# Patient Record
Sex: Female | Born: 2000 | Hispanic: Yes | Marital: Single | Smoking: Never smoker
Health system: Southern US, Community
[De-identification: ages and names within clinical notes are randomized; demographics above are authoritative.]

## PROBLEM LIST (undated history)

## (undated) DIAGNOSIS — Z789 Other specified health status: Secondary | ICD-10-CM

## (undated) HISTORY — PX: NO PAST SURGERIES: SHX2092

---

## 2019-11-22 ENCOUNTER — Other Ambulatory Visit (INDEPENDENT_AMBULATORY_CARE_PROVIDER_SITE_OTHER): Payer: Self-pay

## 2019-11-22 ENCOUNTER — Other Ambulatory Visit: Payer: Self-pay

## 2019-11-22 DIAGNOSIS — Z3481 Encounter for supervision of other normal pregnancy, first trimester: Secondary | ICD-10-CM

## 2019-11-22 LAB — POCT URINALYSIS DIP (MANUAL ENTRY)
Bilirubin, UA: NEGATIVE
Blood, UA: NEGATIVE
Glucose, UA: NEGATIVE mg/dL
Ketones, POC UA: NEGATIVE mg/dL
Nitrite, UA: NEGATIVE
Protein Ur, POC: NEGATIVE mg/dL
Spec Grav, UA: 1.025 (ref 1.010–1.025)
Urobilinogen, UA: 0.2 E.U./dL
pH, UA: 6.5 (ref 5.0–8.0)

## 2019-11-22 LAB — POCT UA - MICROSCOPIC ONLY: Epithelial cells, urine per micros: >20

## 2019-11-24 ENCOUNTER — Encounter: Payer: Self-pay | Admitting: Family Medicine

## 2019-11-24 DIAGNOSIS — B951 Streptococcus, group B, as the cause of diseases classified elsewhere: Secondary | ICD-10-CM | POA: Insufficient documentation

## 2019-11-24 LAB — HGB FRACTIONATION CASCADE
Hgb A2: 2.8 % (ref 1.8–3.2)
Hgb A: 97.2 % (ref 96.4–98.8)
Hgb F: 0 % (ref 0.0–2.0)
Hgb S: 0 %

## 2019-11-24 LAB — OBSTETRIC PANEL, INCLUDING HIV
Antibody Screen: NEGATIVE
Basophils Absolute: 0 10*3/uL (ref 0.0–0.2)
Basos: 0 %
EOS (ABSOLUTE): 0.1 10*3/uL (ref 0.0–0.4)
Eos: 1 %
HIV Screen 4th Generation wRfx: NONREACTIVE
Hematocrit: 34.8 % (ref 34.0–46.6)
Hemoglobin: 11.9 g/dL (ref 11.1–15.9)
Hepatitis B Surface Ag: NEGATIVE
Immature Grans (Abs): 0 10*3/uL (ref 0.0–0.1)
Immature Granulocytes: 0 %
Lymphocytes Absolute: 2.3 10*3/uL (ref 0.7–3.1)
Lymphs: 22 %
MCH: 31.3 pg (ref 26.6–33.0)
MCHC: 34.2 g/dL (ref 31.5–35.7)
MCV: 92 fL (ref 79–97)
Monocytes Absolute: 0.6 10*3/uL (ref 0.1–0.9)
Monocytes: 6 %
Neutrophils Absolute: 7.4 10*3/uL — ABNORMAL HIGH (ref 1.4–7.0)
Neutrophils: 71 %
Platelets: 223 10*3/uL (ref 150–450)
RBC: 3.8 x10E6/uL (ref 3.77–5.28)
RDW: 14.5 % (ref 11.7–15.4)
RPR Ser Ql: NONREACTIVE
Rh Factor: POSITIVE
Rubella Antibodies, IGG: 5.19 index (ref >0.99)
WBC: 10.5 10*3/uL (ref 3.4–10.8)

## 2019-11-24 LAB — HEPATITIS C ANTIBODY: Hep C Virus Ab: <0.1 s/co ratio (ref 0.0–0.9)

## 2019-11-26 LAB — URINE CULTURE, OB REFLEX

## 2019-11-26 LAB — CULTURE, OB URINE

## 2019-11-28 NOTE — Progress Notes (Signed)
Patient Name: Whitney Allen Date of Birth: 2000/08/19 [facility] Medicine Center Initial Prenatal Visit  Whitney Allen is a 19 y.o. year old No obstetric history on file. at Unknown who presents for her initial prenatal visit. Pregnancy is not planned She reports no symptoms. She is taking a prenatal vitamin.  She denies pelvic pain or vaginal bleeding.   Pregnancy Dating: . The patient is dated by LMP, but it is uncertain. She had an early [place] at a pregnancy center, reportedly EDD was 04/21/20 on 08/27/19. Had patient sign ROI to attempt to retrieve this imaging report.  Marland Kitchen LMP: 06/10/19 . Period is certain:  Yes.  . Periods were regular:  Yes.  Marland Kitchen LMP was a typical period:  No.  . Using hormonal contraception in 3 months prior to conception: Yes  Lab Review: . Blood type: A . Rh Status: + . Antibody screen: Negative . HIV: Negative . RPR: Negative . Hemoglobin electrophoresis reviewed: Yes . Results of OB urine culture are: Positive for GBS . Rubella: Immune . Hep C Ab: Negative . Varicella status is Immune  PMH: Reviewed and as detailed below: . HTN: No  . Gestational Hypertension/preeclampsia: No  . Type 1 or 2 Diabetes: No  . Depression:  No  . Seizure disorder:  No . VTE: No ,  . History of STI No,  . Abnormal Pap smear:  No, . Genital herpes simplex:  No   PSH: . Gynecologic Surgery:  no . Surgical history reviewed, notable for: none  Obstetric History: . Obstetric history tab updated and reviewed. This is first pregnancy. . Summary of prior pregnancies: n/a . Cesarean delivery: No  . Gestational Diabetes:  No . Hypertension in pregnancy: No . History of preterm birth: No . History of LGA/SGA infant:  No . History of shoulder dystocia: No . Indications for referral were reviewed, and the patient has no obstetric indications for referral to High Risk OB Clinic at this time.   Social History: . Partner's name: Whitney Allen . Tobacco use: No . Alcohol  use:  No . Other substance use:  No  Current Medications:  . Prenatal vitamin  . Reviewed and appropriate in pregnancy.   Genetic and Infection Screen: . Flow Sheet Updated Yes  Prenatal Exam: Gen: Well nourished, well developed.  No distress.  Vitals noted. HEENT: Normocephalic, atraumatic.  Neck supple without cervical lymphadenopathy, thyromegaly or thyroid nodules.  Fair dentition. CV: RRR no murmur, gallops or rubs Lungs: CTA B.  Normal respiratory effort without wheezes or rales. Abd: soft, NTND. +BS.  19cm gravid uterus. Ext: No clubbing, cyanosis or edema. Psych: Normal grooming and dress.  Not depressed or anxious appearing.  Normal thought content and process without flight of ideas or looseness of associations  Fetal heart tones: Appropriate  Assessment/Plan:  Whitney Allen is a 19 y.o. No obstetric history on file. at Unknown who presents to initiate prenatal care. She is doing well.  Current pregnancy issues include late to prenatal care.  1. Routine prenatal care: Marland Kitchen As dating is not reliable, a dating ultrasound has been ordered. Dating tab updated. . Pre-pregnancy weight updated. Expected weight gain this pregnancy is 28-40 pounds . Prenatal labs reviewed, notable for GBS (+) asymptomatic bacteriuria, only 4K CFUs. . Indications for referral to HROB were reviewed and the patient does not meet criteria for referral.  . Medication list reviewed and updated.  . Recommended patient see a dentist for regular care.  . Bleeding and pain  precautions reviewed. . Importance of prenatal vitamins reviewed.  . Genetic screening offered. Patient opted for: no screening. . The patient does not have an indication for aspirin therapy beginning at 12-16 weeks. Aspirin was not  recommended today.  . The patient will not be age 72 or over at time of delivery. Referral to genetic counseling was not offered today.  . The patient has the following risk factors for preexisting  diabetes: Reviewed indications for early 1 hour glucose testing, not indicated . An early 1 hour glucose tolerance test was not ordered. . Pregnancy Medical Home and PHQ-9 forms completed, problems noted: Yes  2. Pregnancy issues include the following which were addressed today:  . Unclear dating. By LMP, patient is [redacted]w[redacted]d, she and her partner had a first trimester US done at a pregnancy center that stated on 08/27/19 EDD was 04/21/20, which would make patient [redacted]w[redacted]d. They do not have a copy of this Korea. ROI signed today to attempt to get the Korea. If unable to get this Korea, will have to use a second trimester Korea for dating. . Patient is most likely 19w, measuring 19cm, and most accurate if early Korea information is correct. Will order Anatomy US which is due now as well.  . Will have patient follow up with OB clinic as that will be last week of 2nd trimester for next visit   Follow up 4 weeks for next prenatal visit.  Shirlean Mylar, MD [facility] Family Medicine Residency, PGY-2

## 2019-11-28 NOTE — Patient Instructions (Addendum)
It was a pleasure to see you today!  1. Congratulations on your pregnancy. You need to take prenatal vitamins each day. Continue to stay away from cigarettes! Also, avoid raw meat, heat up deli meat until steaming, and soft cheeses (any unpasteurized cheese), as well as cat litter.   2. Go to the MAU at [facility] if:  You have pain in your lower abdomen or pelvic area  Your water breaks.  Sometimes it is a big gush of fluid, sometimes it is just a trickle that keeps getting your underwear wet or running down your legs  You have vaginal bleeding.  3. I recommend you follow up in 4 weeks for your next appointment.  4. We are scheduling your anatomy scan next Monday 11/29 at 1 [facility]   Be Well,  Dr. Leary Roca

## 2019-11-29 ENCOUNTER — Ambulatory Visit (INDEPENDENT_AMBULATORY_CARE_PROVIDER_SITE_OTHER): Payer: Self-pay | Admitting: Family Medicine

## 2019-11-29 ENCOUNTER — Encounter: Payer: Self-pay | Admitting: Family Medicine

## 2019-11-29 ENCOUNTER — Other Ambulatory Visit: Payer: Self-pay

## 2019-11-29 VITALS — BP 110/72 | HR 93 | Wt 136.8 lb

## 2019-11-29 DIAGNOSIS — Z3402 Encounter for supervision of normal first pregnancy, second trimester: Secondary | ICD-10-CM

## 2019-11-30 ENCOUNTER — Inpatient Hospital Stay (HOSPITAL_COMMUNITY)
Admission: AD | Admit: 2019-11-30 | Discharge: 2019-11-30 | Disposition: A | Discharge status: Home / Self Care | Payer: Self-pay | Attending: Obstetrics & Gynecology | Admitting: Obstetrics & Gynecology

## 2019-11-30 ENCOUNTER — Encounter: Payer: Self-pay | Admitting: Family Medicine

## 2019-11-30 ENCOUNTER — Encounter (HOSPITAL_COMMUNITY): Payer: Self-pay | Admitting: Obstetrics & Gynecology

## 2019-11-30 ENCOUNTER — Inpatient Hospital Stay (HOSPITAL_BASED_OUTPATIENT_CLINIC_OR_DEPARTMENT_OTHER): Payer: Self-pay

## 2019-11-30 DIAGNOSIS — Z3A19 19 weeks gestation of pregnancy: Secondary | ICD-10-CM

## 2019-11-30 DIAGNOSIS — O4692 Antepartum hemorrhage, unspecified, second trimester: Secondary | ICD-10-CM

## 2019-11-30 DIAGNOSIS — Z3402 Encounter for supervision of normal first pregnancy, second trimester: Secondary | ICD-10-CM | POA: Insufficient documentation

## 2019-11-30 DIAGNOSIS — O0932 Supervision of pregnancy with insufficient antenatal care, second trimester: Secondary | ICD-10-CM | POA: Insufficient documentation

## 2019-11-30 DIAGNOSIS — Z3687 Encounter for antenatal screening for uncertain dates: Secondary | ICD-10-CM

## 2019-11-30 HISTORY — DX: Other specified health status: Z78.9

## 2019-11-30 LAB — WET PREP, GENITAL
Sperm: NONE SEEN
Trich, Wet Prep: NONE SEEN
Yeast Wet Prep HPF POC: NONE SEEN

## 2019-11-30 LAB — URINALYSIS, ROUTINE W REFLEX MICROSCOPIC
Bacteria, UA: NONE SEEN
Bilirubin Urine: NEGATIVE
Glucose, UA: NEGATIVE mg/dL
Ketones, ur: NEGATIVE mg/dL
Leukocytes,Ua: NEGATIVE
Nitrite: NEGATIVE
Protein, ur: NEGATIVE mg/dL
RBC / HPF: >50 RBC/hpf — ABNORMAL HIGH (ref 0–5)
Specific Gravity, Urine: 1.01 (ref 1.005–1.030)
pH: 6 (ref 5.0–8.0)

## 2019-11-30 LAB — GC/CHLAMYDIA PROBE AMP (~~LOC~~) NOT AT ARMC
Chlamydia: NEGATIVE
Comment: NEGATIVE
Comment: NORMAL
Neisseria Gonorrhea: NEGATIVE

## 2019-11-30 IMAGING — US US MFM OB LIMITED
1 series · 14 of 28 positions shown · non-contrast
Comparison: none

[Series 1: us mfm ob limited · 14 of 58 slices shown]
[im 3/58]
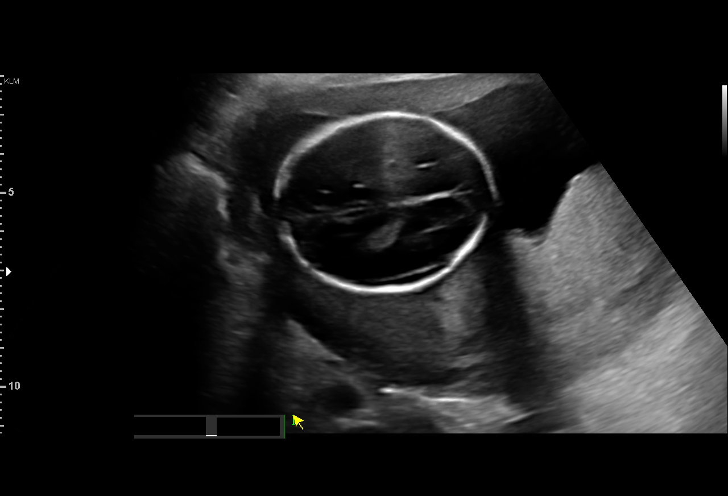
[im 7/58]
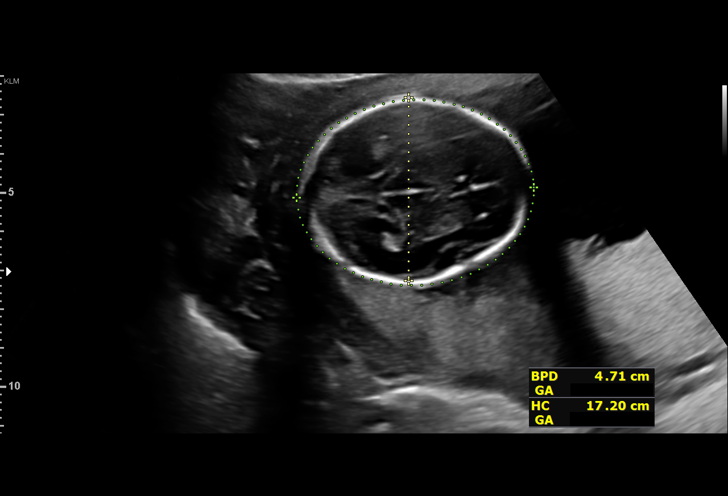
[im 11/58]
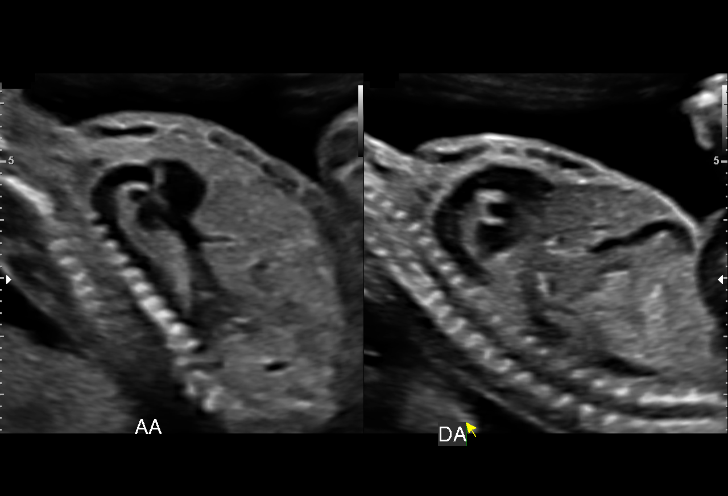
[im 15/58]
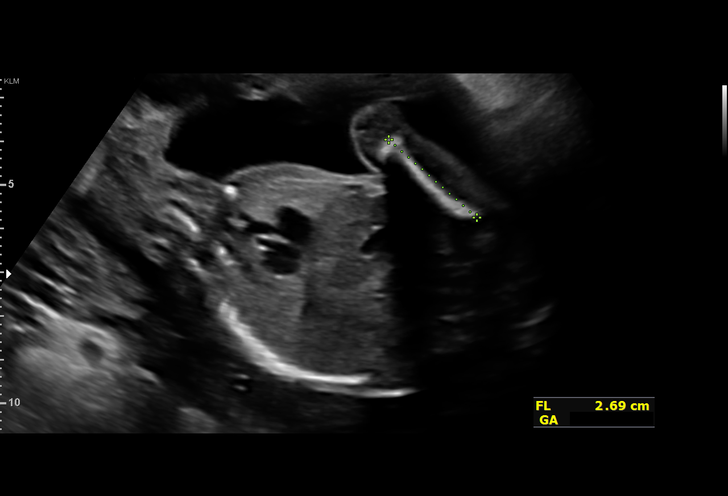
[im 20/58]
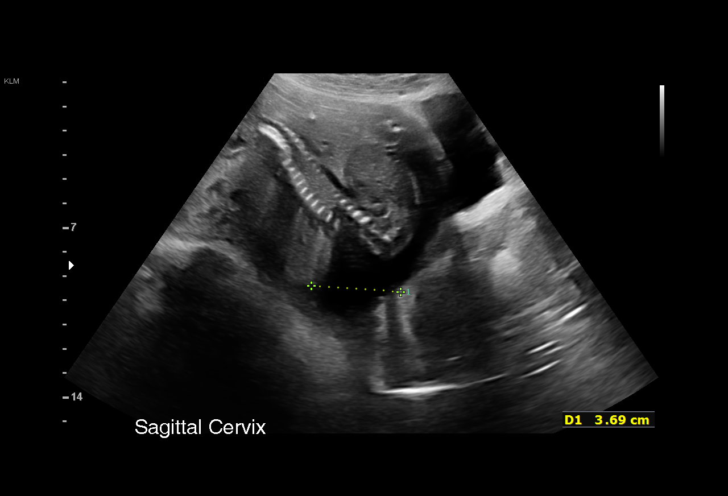
[im 24/58]
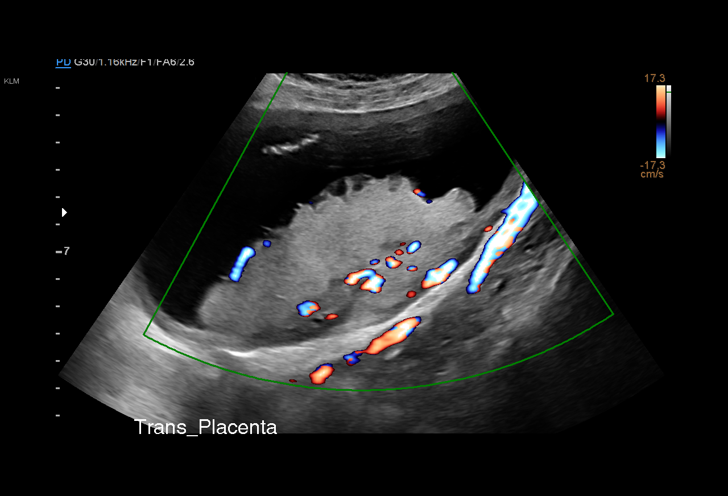
[im 28/58]
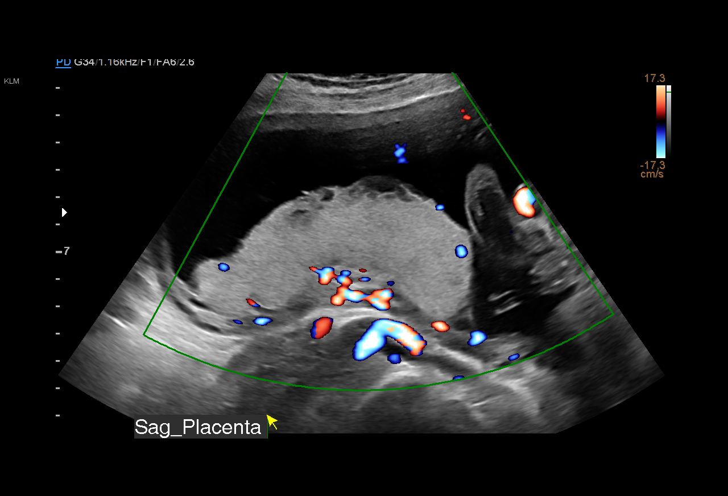
[im 32/58]
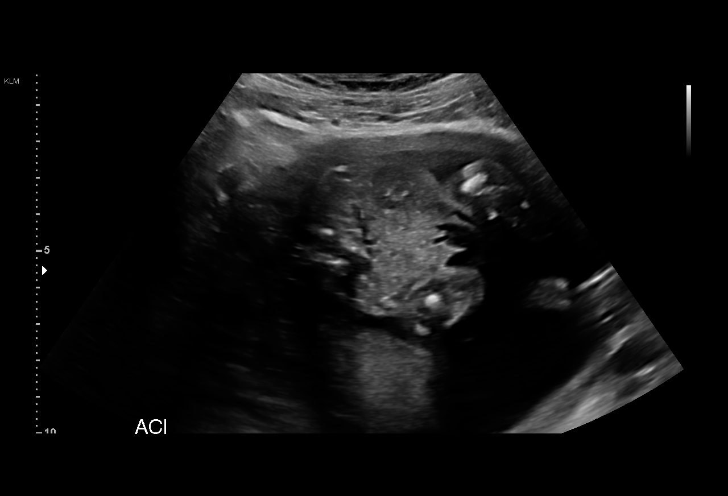
[im 36/58]
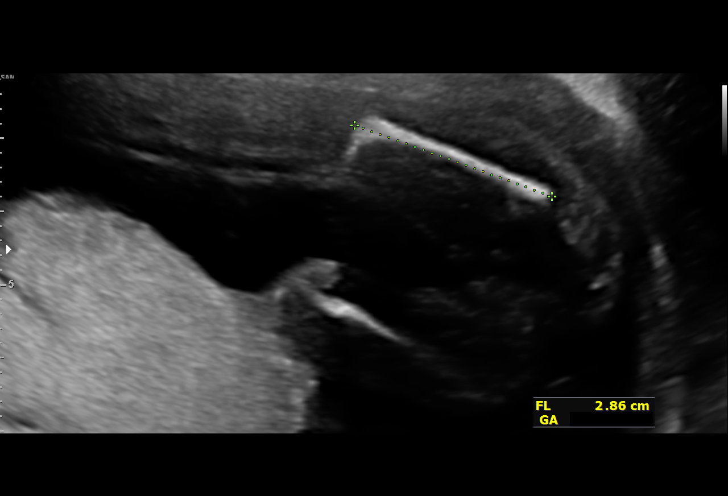
[im 41/58]
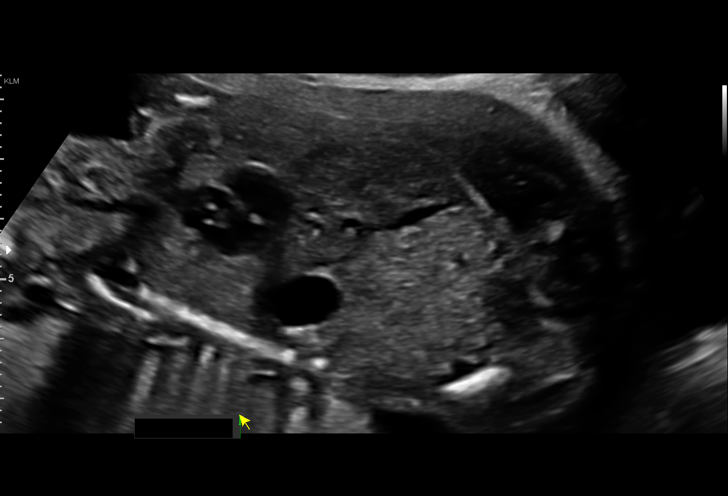
[im 45/58]
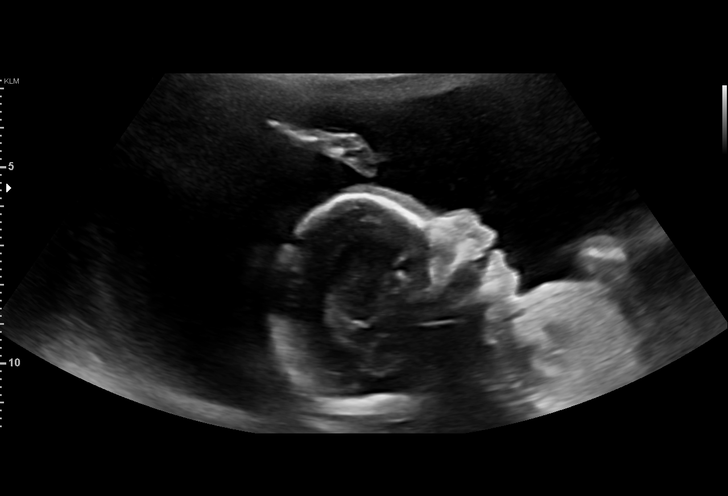
[im 49/58]
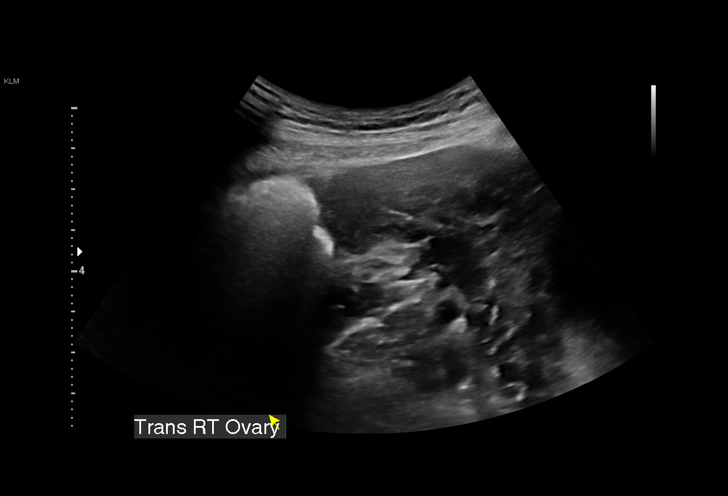
[im 53/58]
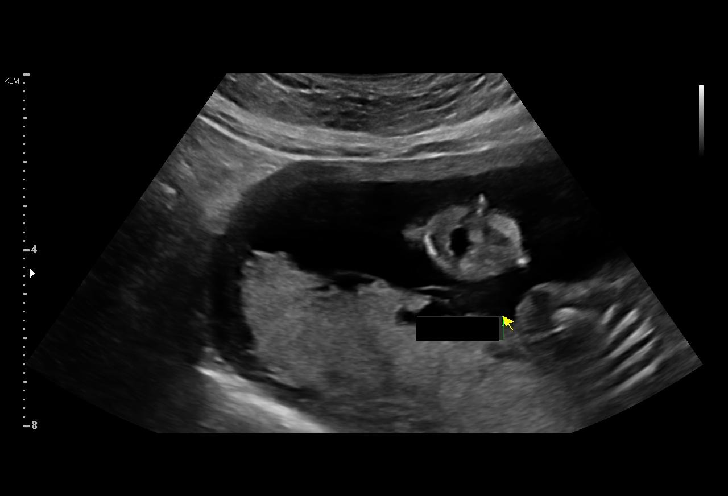
[im 58/58]
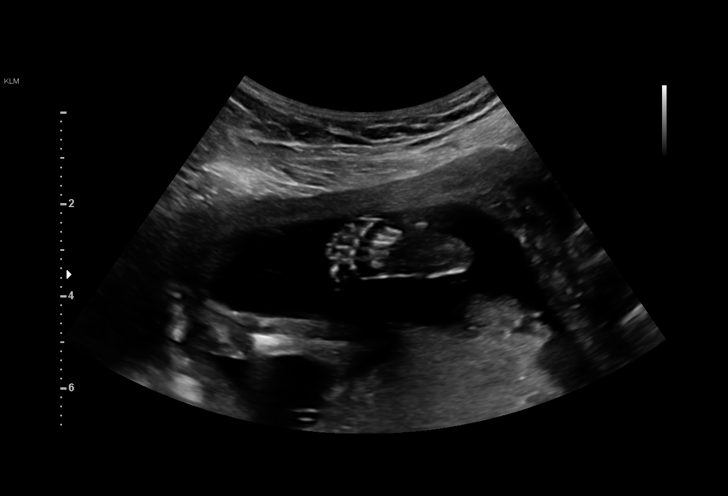

[14 of 28 positions shown; findings below may reference images not displayed]

Referred By:      [facility]                [facility]
                   [facility] CN[facility]                               [HOSPITAL]

 1  US MFM OB LIMITED                     76815.01    [facility]

Indications

 Vaginal bleeding in pregnancy, second          [XN]
 trimester
 19 weeks gestation of pregnancy
 Encounter for uncertain dates                  [XN]
 Late prenatal care, second trimester           [XN]
Fetal Evaluation

 Num Of Fetuses:         1
 Fetal Heart Rate(bpm):  150
 Cardiac Activity:       Observed
 Presentation:           Breech
 Placenta:               Posterior

 Amniotic Fluid
 AFI FV:      Within normal limits

                             Largest Pocket(cm)
                             4

 Comment:    No placental abruption or previa identified.
Biometry

 BPD:      46.7  mm     G. Age:  20w 1d         67  %    CI:        75.68   %    70 - 86
                                                         FL/HC:      16.0   %    16.8 -
 HC:      170.2  mm     G. Age:  19w 4d         38  %    HC/AC:      1.11        1.09 -
 AC:      153.6  mm     G. Age:  20w 4d         72  %    FL/BPD:     58.2   %
 FL:       27.2  mm     G. Age:  18w 2d          6  %    FL/AC:      17.7   %    20 - 24
 HUM:      26.8  mm     G. Age:  18w 4d         20  %

 Est. FW:     299  gm    0 lb 11 oz      36  %
OB History

 Gravidity:    1         Term:   0        Prem:   0        SAB:   0
 TOP:          0       Ectopic:  0        Living: 0
Gestational Age

 U/S Today:     19w 5d                                        EDD:   [DATE]
 Best:          19w 5d     Det. By:  U/S ([DATE])           EDD:   [DATE]
Anatomy

 Cranium:               Appears normal         Aortic Arch:            Appears normal
 Cavum:                 Appears normal         Ductal Arch:            Appears normal
 Ventricles:            Appears normal         Diaphragm:              Appears normal
 Choroid Plexus:        Appears normal         Stomach:                Appears normal, left
                                                                       sided
 Cerebellum:            Appears normal         Abdomen:                Appears normal
 Face:                  Profile appears        Abdominal Wall:         Appears nml (cord
                        normal                                         insert, abd wall)
 Lips:                  Appears normal         Kidneys:                Appear normal
 Thoracic:              Appears normal         Bladder:                Appears normal
 Heart:                 Appears normal
                        (4CH, axis, and
                        situs)
Cervix Uterus Adnexa

 Cervix
 Length:              4  cm.
 Normal appearance by transabdominal scan.

 Uterus
 No abnormality visualized.

 Right Ovary
 Within normal limits.

 Left Ovary
 Not visualized.

 Adnexa
 No abnormality visualized.
Impression

 Limited exam to assess vaginal bleeding and late to prenatal
 care.
 Normal anatomy was obtained, dated by today's examination.
 Suboptimal views of the fetal anatomy was obtatined
 secondary to fetal position.
 There is good fetal movement and amniotic fluid
 There is no evidence of placental previa or placenta previa
Recommendations

 Follow up growth and  to complete anatomy in 4 weeks.

## 2019-11-30 NOTE — Discharge Instructions (Signed)
DO NOT HAVE SEX UNTIL BLEEDING HAS STOPPED COMPLETELY  Return to MAU:  If you have heavier bleeding that soaks through more that 2 pads per hour for an hour or more  If you bleed so much that you feel like you might pass out or you do pass out  If you have significant abdominal pain that is not improved with Tylenol 1000 mg every 6 hours as needed for pain  If you develop a fever > 100.5

## 2019-11-30 NOTE — MAU Provider Note (Signed)
History     CSN: [id number]  Arrival date and time: 11/30/19 0341   First Provider Initiated Contact with Patient 11/30/19 [phone]      Chief Complaint  Patient presents with  . Vaginal Bleeding   Ms. Whitney Allen is a 19 y.o. year old G1P0 female at [redacted]w[redacted]d weeks gestation who presents to MAU reporting vaginal bleeding after SI about 1 hour ago. She has an unknown LMP of "between June and July." She's known she was pregnant since August, applied for Medicaid, but was denied. She is now taking part in the [employer] program. She was seen today for her NOB appt at the [facility]. She was told she was 18-[redacted] weeks pregnant. She has her anatomy ultrasound on 12/06/2019 at 1 pm. Her next appt is 12/23/2019. Boyfriend, Elita Quick', present and contributing to history taking.   OB History    Gravida  1   Para      Term      Preterm      AB      Living        SAB      TAB      Ectopic      Multiple      Live Births              Past Medical History:  Diagnosis Date  . Medical history non-contributory     Past Surgical History:  Procedure Laterality Date  . NO PAST SURGERIES      History reviewed. No pertinent family history.  Social History   Tobacco Use  . Smoking status: Never Smoker  . Smokeless tobacco: Never Used  Vaping Use  . Vaping Use: Never used  Substance Use Topics  . Alcohol use: Never  . Drug use: Never    Allergies: No Known Allergies  Medications Prior to Admission  Medication Sig Dispense Refill Last Dose  . Prenatal Vit-Fe Fumarate-FA (MULTIVITAMIN-PRENATAL) 27-0.8 MG TABS tablet Take 1 tablet by mouth daily at 12 noon.   11/29/2019 at Unknown time    Review of Systems  Constitutional: Negative.   HENT: Negative.   Eyes: Negative.   Respiratory: Negative.   Cardiovascular: Negative.   Gastrointestinal: Negative.   Endocrine: Negative.   Genitourinary: Positive for vaginal bleeding.  Musculoskeletal: Negative.   Skin:  Negative.   Allergic/Immunologic: Negative.   Neurological: Negative.   Hematological: Negative.   Psychiatric/Behavioral: Negative.    Physical Exam   Blood pressure 114/70, pulse 81, temperature 97.9 F (36.6 C), temperature source Oral, resp. rate 16, height 5\' 2"  (1.575 m), weight 62.7 kg, SpO2 98 %.  Physical Exam Vitals and nursing note reviewed. Exam conducted with a chaperone present.  Constitutional:      Appearance: Normal appearance. She is normal weight.  HENT:     Head: Normocephalic and atraumatic.  Cardiovascular:     Rate and Rhythm: Normal rate.     Pulses: Normal pulses.  Pulmonary:     Effort: Pulmonary effort is normal.  Genitourinary:    General: Normal vulva.     Comments: Uterus: gravid, S=D, SE: cervix is smooth, pink, no lesions, small amt of red blood in the vaginal vault -- WP, GC/CT done, closed/long/firm, no CMT or friability, no adnexal tenderness  Musculoskeletal:        General: Normal range of motion.     Cervical back: Normal range of motion.  Skin:    General: Skin is warm and dry.  Neurological:  Mental Status: She is alert and oriented to person, place, and time.  Psychiatric:        Mood and Affect: Mood normal.        Behavior: Behavior normal.        Thought Content: Thought content normal.        Judgment: Judgment normal.    FHTs by doppler: 140 bpm  MAU Course  Procedures  MDM CCUA Wet Prep GC/CT -- pending HIV -- pending OB MFM Limited U/S  Results for orders placed or performed during the hospital encounter of 11/30/19 (from the past 24 hour(s))  Urinalysis, Routine w reflex microscopic Urine, Clean Catch     Status: Abnormal   Collection Time: 11/30/19  4:10 AM  Result Value Ref Range   Color, Urine YELLOW YELLOW   APPearance CLEAR CLEAR   Specific Gravity, Urine 1.010 1.005 - 1.030   pH 6.0 5.0 - 8.0   Glucose, UA NEGATIVE NEGATIVE mg/dL   Hgb urine dipstick LARGE (A) NEGATIVE   Bilirubin Urine NEGATIVE  NEGATIVE   Ketones, ur NEGATIVE NEGATIVE mg/dL   Protein, ur NEGATIVE NEGATIVE mg/dL   Nitrite NEGATIVE NEGATIVE   Leukocytes,Ua NEGATIVE NEGATIVE   RBC / HPF >50 (H) 0 - 5 RBC/hpf   WBC, UA 0-5 0 - 5 WBC/hpf   Bacteria, UA NONE SEEN NONE SEEN   Squamous Epithelial / LPF 0-5 0 - 5   Mucus PRESENT   Wet prep, genital     Status: Abnormal   Collection Time: 11/30/19  4:33 AM  Result Value Ref Range   Yeast Wet Prep HPF POC NONE SEEN NONE SEEN   Trich, Wet Prep NONE SEEN NONE SEEN   Clue Cells Wet Prep HPF POC PRESENT (A) NONE SEEN   WBC, Wet Prep HPF POC FEW (A) NONE SEEN   Sperm NONE SEEN    Media Information              Assessment and Plan  Vaginal bleeding in pregnancy, second trimester  - Reassurance given that bleeding is more than likely from friable cervix - Explained that a friable cervix is a normal variation of pregnancy and can happen as a result of recent SI. - Discussed lab results - Information provided on vaginal bleeding in 2nd trimester - Advised to refrain from sex until bleeding has stopped completely - Return to MAU:  If you have heavier bleeding that soaks through more that 2 pads per hour for an hour or more  If you bleed so much that you feel like you might pass out or you do pass out  If you have significant abdominal pain that is not improved with Tylenol 1000 mg every 6 hours as needed for pain  If you develop a fever > 100.5 - Discharge home - Keep scheduled appts with [facility] and [facility] - Patient and boyfriend verbalized an understanding of the plan of care and agrees.     Raelyn Mora, MSN, CNM 11/30/2019, 4:12 AM

## 2019-11-30 NOTE — MAU Note (Signed)
..  Whitney Allen is a 19 y.o. at Unknown here in MAU reporting: vaginal bleeding that began shortly after intercourse.  The bleeding did not require her to wear a pad into the hospital.  LMP: patient is unsure. States it could have been between June or August Pain score: 0/10 Vitals:   11/30/19 0352  BP: 114/70  Pulse: 81  Resp: 16  Temp: 97.9 F (36.6 C)  SpO2: 98%     FHT doppler: 140 Lab orders placed from triage: UA

## 2019-12-01 ENCOUNTER — Telehealth: Payer: Self-pay

## 2019-12-01 NOTE — Telephone Encounter (Signed)
Ananda, from health department, calls nurse line requesting intervention for patients positive phq9, score of 10 with negative SI. Ananda is wondering if we can offer the patient counseling at our office. Patient does not have any insurance. Will forward to PCP for advisement.

## 2019-12-06 NOTE — Telephone Encounter (Signed)
We can offer resources for counseling as well as medical management (if necessary). Will check in with patient about this at next appointment.  Shirlean Mylar, MD [facility] Family Medicine Residency, PGY-2

## 2020-01-12 NOTE — Progress Notes (Signed)
  [facility] Center Prenatal Visit  Whitney Allen is a 20 y.o. G1P0 at [redacted]w[redacted]d here for routine follow up. She is dated by anatomy US at 19w on 11/30/19.  She reports fatigue. She reports fetal movement. Denies vaginal bleeding, loss of fluid, or contractions.  See flow sheet for details.  A/P: Pregnancy at [redacted]w[redacted]d.  Doing well.   . Dating reviewed, dating tab is correct . Fetal heart tones Appropriate . Fundal height within expected range.  . Influenza vaccine not administered as patient declined, will continue to discuss.   Marland Kitchen COVID vaccination was discussed and patient declined, will continue to discuss.  . Screening for gestational diabetes Not completed as patient is not indicated to have early GTT. Patient will have glucola done on 02/03/20. .  . Pregnancy education completed including: fetal growth, breastfeeding, contraception, and expected weight gain in pregnancy.   . The patient does not have a history of Cesarean delivery and no referral to [facility] is indicated . Scheduled for [facility] during second trimester on 02/03/20 (first available since December). . Preterm labor, bleeding, and pain precautions given.    2. Pregnancy issues include the following and were addressed as appropriate today:  . Patient has gained 14 pounds in 6 weeks, counseled on eating small, frequent meals. . Problem list and pregnancy box updated: Yes.         Patient went to the MAU for vaginal bleeding in November, has rather large bill and no insurance. Her partner reports that she was denied Medicaid because she incorrectly filled out the form last time. This is what caused delay in establishing prenatal care. They would like help navigating this to correctly fill out the form. Will refer to CCM. Recommended patient and her partner contact DSS and reapply for insurance.   Follow up 4 weeks.

## 2020-01-13 ENCOUNTER — Ambulatory Visit (INDEPENDENT_AMBULATORY_CARE_PROVIDER_SITE_OTHER): Payer: Self-pay | Admitting: Family Medicine

## 2020-01-13 ENCOUNTER — Other Ambulatory Visit: Payer: Self-pay

## 2020-01-13 DIAGNOSIS — Z3402 Encounter for supervision of normal first pregnancy, second trimester: Secondary | ICD-10-CM

## 2020-01-13 NOTE — Patient Instructions (Addendum)
It was a pleasure to see you today!  1. Go to the MAU at [facility] at [facility] if:  You have cramping/contractions that do not go away with drinking water  Your water breaks.  Sometimes it is a big gush of fluid, sometimes it is just a trickle that keeps getting your underwear wet or running down your legs  You have vaginal bleeding.     You do not feel your baby moving like normal.  If you do not, get something to eat and drink and lay down and focus on feeling your baby move. If your baby is still not moving like normal, you should go to MAU.  2. I recommend eating small, frequent meals  3. Return on January 27 for your next appointment and lab work. Please arrive at 8:30 AM and do not eat anything for your glucose (sugar) test.  4. I recommend you connect with the Department of Social Services to help with medicaid, and also I will discuss this with our social worker, Sammuel Hines, who may contact you for directions on how to establish medicaid forms correctly.  5. Parenting and newborn classes can be found at [email or web address]  Be Well,  Dr. Leary Roca

## 2020-01-13 NOTE — Addendum Note (Signed)
Addended by: Horton Chin on: 01/13/2020 06:08 PM   Modules accepted: Orders

## 2020-01-17 ENCOUNTER — Telehealth: Payer: Self-pay

## 2020-01-17 NOTE — Telephone Encounter (Signed)
    MA1/10/2020 1st Attempt  Name: Raymie Trani   MRN: [id number]   DOB: 11-12-2000   AGE: 20 y.o.   GENDER: female   PCP Shirlean Mylar, MD.   Referral Reason: Medicaid assistance  Interventions: Successful outbound call placed to the patient Patient provided with information about care guide support team and interviewed to confirm resource needs Discussed resources to assist with Medicaid  Follow up plan: Care guide will follow up with patient by phone over the next 5 days  Laykin Rainone, AAS Paralegal, [facility] Care Guide . Embedded Care Coordination [facility] Health  Care Management  [address] [place], [place] [address] [email or web address]  [phone]   [email or web address]

## 2020-01-21 ENCOUNTER — Telehealth: Payer: Self-pay

## 2020-01-21 NOTE — Telephone Encounter (Signed)
    Telephone encounter was:  Unsuccessful.  01/21/20 Name: Whitney Allen MRN: [id number] DOB: April 17, 2000  Unsuccessful outbound call made today to assist with:  Medicaid assistance  Outreach Attempt:  1st Attempt . A HIPAA compliant voice message was left requesting a return call.  Instructed patient to call back at [phone]. 1st attempt to follow-up with patient. Unable to leave message voicemail not set-up. Will attempt to call again in the next 5 days.    Bilan Tedesco, AAS Paralegal, [facility] Care Guide . Embedded Care Coordination [facility] Health  Care Management  [address] [place], [place] [address] [email or web address]  [phone]   [email or web address]

## 2020-01-26 ENCOUNTER — Telehealth: Payer: Self-pay

## 2020-01-26 NOTE — Telephone Encounter (Signed)
   Telephone encounter was:  Successful.  01/26/2020 Name: Whitney Allen MRN: [id number] DOB: [birth year]  Whitney Allen is a 21 y.o. year old female who is a primary care patient of Shirlean Mylar, MD . The community resource team was consulted for assistance with [facility]  Care guide performed the following interventions: Follow up call placed to the patient to discuss status of referral Patient stated that she did not need assistance applying. She plans on calling tomorrow to apply..  Follow Up Plan:  No further follow up planned at this time. The patient has been provided with needed resources.  Joncarlo Friberg, AAS Paralegal, [facility] Care Guide . Embedded Care Coordination [facility] Health  Care Management  [address] [place], [place] [address] [name]  ?? [phone]   [email or web address]

## 2020-02-01 ENCOUNTER — Emergency Department (HOSPITAL_COMMUNITY): Admission: EM | Admit: 2020-02-01 | Discharge: 2020-02-01 | Discharge status: Against Medical Advice | Payer: Self-pay

## 2020-02-01 ENCOUNTER — Other Ambulatory Visit: Payer: Self-pay

## 2020-02-03 ENCOUNTER — Ambulatory Visit (INDEPENDENT_AMBULATORY_CARE_PROVIDER_SITE_OTHER): Payer: Self-pay | Admitting: Family Medicine

## 2020-02-03 ENCOUNTER — Other Ambulatory Visit: Payer: Self-pay

## 2020-02-03 VITALS — BP 98/60 | HR 77 | Wt 157.0 lb

## 2020-02-03 DIAGNOSIS — Z3403 Encounter for supervision of normal first pregnancy, third trimester: Secondary | ICD-10-CM

## 2020-02-03 DIAGNOSIS — Z3402 Encounter for supervision of normal first pregnancy, second trimester: Secondary | ICD-10-CM

## 2020-02-03 LAB — POCT 1 HR PRENATAL GLUCOSE: Glucose 1 Hr Prenatal, POC: 138 mg/dL

## 2020-02-03 NOTE — Patient Instructions (Signed)
It was nice to meet you today!  Flu and Tdap shots today Routine labs today  Strongly recommend you get the COVID vaccine. See handouts on this.  Go to the MAU at [facility] at [facility] if:  You have cramping/contractions that do not go away with drinking water  Your water breaks.  Sometimes it is a big gush of fluid, sometimes it is just a trickle that keeps getting your underwear wet or running down your legs  You have vaginal bleeding.     You do not feel your baby moving like normal.  If you do not, get something to eat and drink and lay down and focus on feeling your baby move. If your baby is still not moving like normal, you should go to MAU.   Follow up in 2 weeks for next prenatal visit  Be well, Dr. Pollie Meyer    Third Trimester of Pregnancy  The third trimester of pregnancy is from week 28 through week 40. This is months 7 through 9. The third trimester is a time when the unborn baby (fetus) is growing rapidly. At the end of the ninth month, the fetus is about 20 inches long and weighs 6-10 pounds. Body changes during your third trimester During the third trimester, your body will continue to go through many changes. The changes vary and generally return to normal after your baby is born. Physical changes  Your weight will continue to increase. You can expect to gain 25-35 pounds (11-16 kg) by the end of the pregnancy if you begin pregnancy at a normal weight. If you are underweight, you can expect to gain 28-40 lb (about 13-18 kg), and if you are overweight, you can expect to gain 15-25 lb (about 7-11 kg).  You may begin to get stretch marks on your hips, abdomen, and breasts.  Your breasts will continue to grow and may hurt. A yellow fluid (colostrum) may leak from your breasts. This is the first milk you are producing for your baby.  You may have changes in your hair. These can include thickening of your hair, rapid growth, and changes in texture.  Some people also have hair loss during or after pregnancy, or hair that feels dry or thin.  Your belly button may stick out.  You may notice more swelling in your hands, face, or ankles. Health changes  You may have heartburn.  You may have constipation.  You may develop hemorrhoids.  You may develop swollen, bulging veins (varicose veins) in your legs.  You may have increased body aches in the pelvis, back, or thighs. This is due to weight gain and increased hormones that are relaxing your joints.  You may have increased tingling or numbness in your hands, arms, and legs. The skin on your abdomen may also feel numb.  You may feel short of breath because of your expanding uterus. Other changes  You may urinate more often because the fetus is moving lower into your pelvis and pressing on your bladder.  You may have more problems sleeping. This may be caused by the size of your abdomen, an increased need to urinate, and an increase in your body's metabolism.  You may notice the fetus "dropping," or moving lower in your abdomen (lightening).  You may have increased vaginal discharge.  You may notice that you have pain around your pelvic bone as your uterus distends. Follow these instructions at home: Medicines  Follow your health care provider's instructions regarding medicine  use. Specific medicines may be either safe or unsafe to take during pregnancy. Do not take any medicines unless approved by your health care provider.  Take a prenatal vitamin that contains at least 600 micrograms (mcg) of folic acid. Eating and drinking  Eat a healthy diet that includes fresh fruits and vegetables, whole grains, good sources of protein such as meat, eggs, or tofu, and low-fat dairy products.  Avoid raw meat and unpasteurized juice, milk, and cheese. These carry germs that can harm you and your baby.  Eat 4 or 5 small meals rather than 3 large meals a day.  You may need to take these  actions to prevent or treat constipation: ? Drink enough fluid to keep your urine pale yellow. ? Eat foods that are high in fiber, such as beans, whole grains, and fresh fruits and vegetables. ? Limit foods that are high in fat and processed sugars, such as fried or sweet foods. Activity  Exercise only as directed by your health care provider. Most people can continue their usual exercise routine during pregnancy. Try to exercise for 30 minutes at least 5 days a week. Stop exercising if you experience contractions in the uterus.  Stop exercising if you develop pain or cramping in the lower abdomen or lower back.  Avoid heavy lifting.  Do not exercise if it is very hot or humid or if you are at a high altitude.  If you choose to, you may continue to have sex unless your health care provider tells you not to. Relieving pain and discomfort  Take frequent breaks and rest with your legs raised (elevated) if you have leg cramps or low back pain.  Take warm sitz baths to soothe any pain or discomfort caused by hemorrhoids. Use hemorrhoid cream if your health care provider approves.  Wear a supportive bra to prevent discomfort from breast tenderness.  If you develop varicose veins: ? Wear support hose as told by your health care provider. ? Elevate your feet for 15 minutes, 3-4 times a day. ? Limit salt in your diet. Safety  Talk to your health care provider before traveling far distances.  Do not use hot tubs, steam rooms, or saunas.  Wear your seat belt at all times when driving or riding in a car.  Talk with your health care provider if someone is verbally or physically abusive to you. Preparing for birth To prepare for the arrival of your baby:  Take prenatal classes to understand, practice, and ask questions about labor and delivery.  Visit the hospital and tour the maternity area.  Purchase a rear-facing car seat and make sure you know how to install it in your  car.  Prepare the baby's room or sleeping area. Make sure to remove all pillows and stuffed animals from the baby's crib to prevent suffocation. General instructions  Avoid cat litter boxes and soil used by cats. These carry germs that can cause birth defects in the baby. If you have a cat, ask someone to clean the litter box for you.  Do not douche or use tampons. Do not use scented sanitary pads.  Do not use any products that contain nicotine or tobacco, such as cigarettes, e-cigarettes, and chewing tobacco. If you need help quitting, ask your health care provider.  Do not use any herbal remedies, illegal drugs, or medicines that were not prescribed to you. Chemicals in these products can harm your baby.  Do not drink alcohol.  You will have more frequent  prenatal exams during the third trimester. During a routine prenatal visit, your health care provider will do a physical exam, perform tests, and discuss your overall health. Keep all follow-up visits. This is important. Where to find more information  American Pregnancy Association: americanpregnancy.org  [employer]ians and Gynecologists: https://www.todd-brady.net/  Office on [employer] Health: [employer]co.nz Contact a health care provider if you have:  A fever.  Mild pelvic cramps, pelvic pressure, or nagging pain in your abdominal area or lower back.  Vomiting or diarrhea.  Bad-smelling vaginal discharge or foul-smelling urine.  Pain when you urinate.  A headache that does not go away when you take medicine.  Visual changes or see spots in front of your eyes. Get help right away if:  Your water breaks.  You have regular contractions less than 5 minutes apart.  You have spotting or bleeding from your vagina.  You have severe abdominal pain.  You have difficulty breathing.  You have chest pain.  You have fainting spells.  You have not felt your baby move for the time  period told by your health care provider.  You have new or increased pain, swelling, or redness in an arm or leg. Summary  The third trimester of pregnancy is from week 28 through week 40 (months 7 through 9).  You may have more problems sleeping. This can be caused by the size of your abdomen, an increased need to urinate, and an increase in your body's metabolism.  You will have more frequent prenatal exams during the third trimester. Keep all follow-up visits. This is important. This information is not intended to replace advice given to you by your health care provider. Make sure you discuss any questions you have with your health care provider. Document Revised: 06/02/2019 Document Reviewed: 04/08/2019 Elsevier Patient Education  2021 [employer].

## 2020-02-03 NOTE — Progress Notes (Signed)
[facility] Visit  Shakema Benites Fogal is a 20 y.o. G1P0 at [redacted]w[redacted]d (via [redacted]w[redacted]d u/s) who presents to [facility] for routine follow up. Prenatal course, history, notes, ultrasounds, and laboratory results reviewed.  Denies cramping/ctx, fluid leaking, vaginal bleeding, or decreased fetal movement. Taking PNV.    Seen at [facility] ED for dog bite the other day. Required stitches. Bite is on L side of face near temple. Denies receiving Tdap during that visit. Got rx for antibiotics, she has not started yet, does not know name of antibiotics. Needs sutures removed Monday.  Primary Prenatal Care Provider: Dr. Leary Roca  Postpartum Plans: - delivery planning: plans SVD - circumcision: having girl - feeding: breastfeeding - pediatrician: [facility] - contraception: postplacental IUD  FHR: 136bpm Uterine size: 29cm  Face with well approximated laceration of L temple with sutures present, no drainage or surrounding erythema. No signs of superinfection.  Assessment & Plan  1. Routine prenatal care: - 1hr GTT done today, elevated at 138, 3hr GTT scheduled for tomorrow - third trimester labs today: CBC, HIV, RPR - reviewed how to sign up for prenatal classes (childbirth, breastfeeding) with patient in detail - Tdap and flu vaccines given today - extensively counseled on importance of COVID vaccine in pregnancy, given printed information as well. I think patient is leaning towards receiving vaccine but is not ready yet for this today. - Labor & fetal movement precautions discussed. - discussed applying for medicaid, case worker present during today's visit  2. GBS bacteriuria - will need intrapartum antibiotics  3. Dog bite on L face - seen at [facility] ED and received sutures and antibiotics (exact antibiotics unclear, and cannot access record in care everywhere). - RN visit appointment scheduled for Monday to remove sutures from  face - Tdap today (patient reports was not given during ED visit) - discussed dog safety with pending arrival of new baby  Next prenatal visit in 2 weeks.   Levert Feinstein, MD [facility]

## 2020-02-04 ENCOUNTER — Other Ambulatory Visit: Payer: Self-pay

## 2020-02-04 LAB — CBC
Hematocrit: 37.8 % (ref 34.0–46.6)
Hemoglobin: 13 g/dL (ref 11.1–15.9)
MCH: 31.9 pg (ref 26.6–33.0)
MCHC: 34.4 g/dL (ref 31.5–35.7)
MCV: 93 fL (ref 79–97)
Platelets: 207 10*3/uL (ref 150–450)
RBC: 4.08 x10E6/uL (ref 3.77–5.28)
RDW: 12.4 % (ref 11.7–15.4)
WBC: 8.9 10*3/uL (ref 3.4–10.8)

## 2020-02-04 LAB — RPR: RPR Ser Ql: NONREACTIVE

## 2020-02-04 LAB — HIV ANTIBODY (ROUTINE TESTING W REFLEX): HIV Screen 4th Generation wRfx: NONREACTIVE

## 2020-02-04 LAB — GLUCOSE TOLERANCE, 1 HOUR: Glucose, 1Hr PP: 135 mg/dL (ref 65–199)

## 2020-02-07 ENCOUNTER — Ambulatory Visit: Payer: Self-pay

## 2020-02-09 ENCOUNTER — Encounter: Payer: Self-pay | Admitting: Family Medicine

## 2020-02-09 ENCOUNTER — Other Ambulatory Visit (INDEPENDENT_AMBULATORY_CARE_PROVIDER_SITE_OTHER): Payer: Self-pay

## 2020-02-09 ENCOUNTER — Ambulatory Visit: Payer: Self-pay

## 2020-02-09 ENCOUNTER — Other Ambulatory Visit: Payer: Self-pay

## 2020-02-09 DIAGNOSIS — Z3402 Encounter for supervision of normal first pregnancy, second trimester: Secondary | ICD-10-CM

## 2020-02-09 LAB — POCT CBG (FASTING - GLUCOSE)-MANUAL ENTRY: Glucose Fasting, POC: 92 mg/dL (ref 70–99)

## 2020-02-10 ENCOUNTER — Encounter: Payer: Self-pay | Admitting: Family Medicine

## 2020-02-10 LAB — GESTATIONAL GLUCOSE TOLERANCE
Glucose, Fasting: 74 mg/dL (ref 65–94)
Glucose, GTT - 1 Hour: 133 mg/dL (ref 65–179)
Glucose, GTT - 2 Hour: 101 mg/dL (ref 65–154)
Glucose, GTT - 3 Hour: 58 mg/dL — ABNORMAL LOW (ref 65–139)

## 2020-02-15 ENCOUNTER — Other Ambulatory Visit: Payer: Self-pay

## 2020-02-15 ENCOUNTER — Ambulatory Visit (INDEPENDENT_AMBULATORY_CARE_PROVIDER_SITE_OTHER): Payer: Self-pay | Admitting: Family Medicine

## 2020-02-15 VITALS — BP 98/62 | HR 93 | Wt 155.4 lb

## 2020-02-15 DIAGNOSIS — Z3403 Encounter for supervision of normal first pregnancy, third trimester: Secondary | ICD-10-CM

## 2020-02-15 DIAGNOSIS — Z3402 Encounter for supervision of normal first pregnancy, second trimester: Secondary | ICD-10-CM

## 2020-02-15 NOTE — Patient Instructions (Addendum)
It was a pleasure to see you today!  1. Go to the MAU at [facility] if:  You begin to have strong, frequent contractions  Your water breaks.  Sometimes it is a big gush of fluid, sometimes it is just a trickle that keeps getting your underwear wet or running down your legs  You have vaginal bleeding.  It is normal to have a small amount of spotting if your cervix was checked.   You do not feel your baby moving like normal.  If you do not, get something to eat and drink and lay down and focus on feeling your baby move.   If your baby is still not moving like normal, you should go to MAU.  2. For childbirth, breastfeeding, and newborn care classes go to: [email or web address]  3. For TDAP and flu vaccine, go to the health department. We recommend you get these while pregnant to help protect your baby from flu, tetatnus, diphtheria, and pertussis.   4. Next visit is on: March 2nd at 1:50 PM  Be Well,  Dr. Leary Roca   Third Trimester of Pregnancy  The third trimester of pregnancy is from week 28 through week 40. This is months 7 through 9. The third trimester is a time when the unborn baby (fetus) is growing rapidly. At the end of the ninth month, the fetus is about 20 inches long and weighs 6-10 pounds. Body changes during your third trimester During the third trimester, your body will continue to go through many changes. The changes vary and generally return to normal after your baby is born. Physical changes  Your weight will continue to increase. You can expect to gain 25-35 pounds (11-16 kg) by the end of the pregnancy if you begin pregnancy at a normal weight. If you are underweight, you can expect to gain 28-40 lb (about 13-18 kg), and if you are overweight, you can expect to gain 15-25 lb (about 7-11 kg).  You may begin to get stretch marks on your hips, abdomen, and breasts.  Your breasts will continue to grow and may hurt. A yellow fluid (colostrum)  may leak from your breasts. This is the first milk you are producing for your baby.  You may have changes in your hair. These can include thickening of your hair, rapid growth, and changes in texture. Some people also have hair loss during or after pregnancy, or hair that feels dry or thin.  Your belly button may stick out.  You may notice more swelling in your hands, face, or ankles. Health changes  You may have heartburn.  You may have constipation.  You may develop hemorrhoids.  You may develop swollen, bulging veins (varicose veins) in your legs.  You may have increased body aches in the pelvis, back, or thighs. This is due to weight gain and increased hormones that are relaxing your joints.  You may have increased tingling or numbness in your hands, arms, and legs. The skin on your abdomen may also feel numb.  You may feel short of breath because of your expanding uterus. Other changes  You may urinate more often because the fetus is moving lower into your pelvis and pressing on your bladder.  You may have more problems sleeping. This may be caused by the size of your abdomen, an increased need to urinate, and an increase in your body's metabolism.  You may notice the fetus "dropping," or moving lower in your abdomen (lightening).  You  may have increased vaginal discharge.  You may notice that you have pain around your pelvic bone as your uterus distends. Follow these instructions at home: Medicines  Follow your health care provider's instructions regarding medicine use. Specific medicines may be either safe or unsafe to take during pregnancy. Do not take any medicines unless approved by your health care provider.  Take a prenatal vitamin that contains at least 600 micrograms (mcg) of folic acid. Eating and drinking  Eat a healthy diet that includes fresh fruits and vegetables, whole grains, good sources of protein such as meat, eggs, or tofu, and low-fat dairy  products.  Avoid raw meat and unpasteurized juice, milk, and cheese. These carry germs that can harm you and your baby.  Eat 4 or 5 small meals rather than 3 large meals a day.  You may need to take these actions to prevent or treat constipation: ? Drink enough fluid to keep your urine pale yellow. ? Eat foods that are high in fiber, such as beans, whole grains, and fresh fruits and vegetables. ? Limit foods that are high in fat and processed sugars, such as fried or sweet foods. Activity  Exercise only as directed by your health care provider. Most people can continue their usual exercise routine during pregnancy. Try to exercise for 30 minutes at least 5 days a week. Stop exercising if you experience contractions in the uterus.  Stop exercising if you develop pain or cramping in the lower abdomen or lower back.  Avoid heavy lifting.  Do not exercise if it is very hot or humid or if you are at a high altitude.  If you choose to, you may continue to have sex unless your health care provider tells you not to. Relieving pain and discomfort  Take frequent breaks and rest with your legs raised (elevated) if you have leg cramps or low back pain.  Take warm sitz baths to soothe any pain or discomfort caused by hemorrhoids. Use hemorrhoid cream if your health care provider approves.  Wear a supportive bra to prevent discomfort from breast tenderness.  If you develop varicose veins: ? Wear support hose as told by your health care provider. ? Elevate your feet for 15 minutes, 3-4 times a day. ? Limit salt in your diet. Safety  Talk to your health care provider before traveling far distances.  Do not use hot tubs, steam rooms, or saunas.  Wear your seat belt at all times when driving or riding in a car.  Talk with your health care provider if someone is verbally or physically abusive to you. Preparing for birth To prepare for the arrival of your baby:  Take prenatal classes to  understand, practice, and ask questions about labor and delivery.  Visit the hospital and tour the maternity area.  Purchase a rear-facing car seat and make sure you know how to install it in your car.  Prepare the baby's room or sleeping area. Make sure to remove all pillows and stuffed animals from the baby's crib to prevent suffocation. General instructions  Avoid cat litter boxes and soil used by cats. These carry germs that can cause birth defects in the baby. If you have a cat, ask someone to clean the litter box for you.  Do not douche or use tampons. Do not use scented sanitary pads.  Do not use any products that contain nicotine or tobacco, such as cigarettes, e-cigarettes, and chewing tobacco. If you need help quitting, ask your health care provider.  Do not use any herbal remedies, illegal drugs, or medicines that were not prescribed to you. Chemicals in these products can harm your baby.  Do not drink alcohol.  You will have more frequent prenatal exams during the third trimester. During a routine prenatal visit, your health care provider will do a physical exam, perform tests, and discuss your overall health. Keep all follow-up visits. This is important. Where to find more information  American Pregnancy Association: americanpregnancy.org  [employer]st[employer]ians and Gynecologists: https://www.todd-brady.net/  Office on [employer] Health: MightyRew[employer].co.nz Contact a health care provider if you have:  A fever.  Mild pelvic cramps, pelvic pressure, or nagging pain in your abdominal area or lower back.  Vomiting or diarrhea.  Bad-smelling vaginal discharge or foul-smelling urine.  Pain when you urinate.  A headache that does not go away when you take medicine.  Visual changes or see spots in front of your eyes. Get help right away if:  Your water breaks.  You have regular contractions less than 5 minutes apart.  You have spotting or  bleeding from your vagina.  You have severe abdominal pain.  You have difficulty breathing.  You have chest pain.  You have fainting spells.  You have not felt your baby move for the time period told by your health care provider.  You have new or increased pain, swelling, or redness in an arm or leg. Summary  The third trimester of pregnancy is from week 28 through week 40 (months 7 through 9).  You may have more problems sleeping. This can be caused by the size of your abdomen, an increased need to urinate, and an increase in your body's metabolism.  You will have more frequent prenatal exams during the third trimester. Keep all follow-up visits. This is important. This information is not intended to replace advice given to you by your health care provider. Make sure you discuss any questions you have with your health care provider. Document Revised: 06/02/2019 Document Reviewed: 04/08/2019 Elsevier Patient Education  2021 [employer].

## 2020-02-15 NOTE — Progress Notes (Signed)
  [facility] Prenatal Visit  Whitney Allen is a 20 y.o. G1P0 at [redacted]w[redacted]d here for routine follow up. She is dated by midtrimester ultrasound.  She reports backache, no bleeding, no contractions, no cramping and no leaking. She reports fetal movement. She denies vaginal bleeding, contractions, or loss of fluid. See flow sheet for details.  Vitals:   02/15/20 1023  BP: 98/62  Pulse: 93    A/P: Pregnancy at [redacted]w[redacted]d.  Doing well.   1. Routine prenatal care:  Marland Kitchen Dating reviewed, dating tab is correct . Fetal heart tones Appropriate . Fundal height within expected range.  . Infant feeding choice: Breastfeeding . Contraception choice: IUD- paraguard . Infant circumcision desired not applicable  . The patient does not have a history of Cesarean delivery and no referral to Center for [facility] is indicated . Influenza vaccine not administered as patient declined, will continue to discuss.  Counseled, recommended to get at [facility]. . Tdap was not given today. Counseled, recommend to get at [facility]. . 1 hour glucola, CBC, RPR, and HIV were not obtained today.  She had elevated 1 hr (138) on 02/03/20, passed 3hr GTT on 02/09/20 (74, 133, 101, 58).  . Rh status was reviewed and patient does not need Rhogam.  Rhogam was not given today.  . Pregnancy medical home and PHQ-9 forms were not done today and reviewed.   . Childbirth and education classes were offered. . Pregnancy education regarding benefits of breastfeeding, contraception, fetal growth, expected weight gain, and safe infant sleep were discussed.  . Preterm labor and fetal movement precautions reviewed.  2. Pregnancy issues include the following and were addressed as appropriate today:  . GBS bacteriuria: will need intrapartum abx        Third trimester labs reviewed: no anemia, HIV and RPR NR.        Counseled extensively on covid vaccine. Patient is interested in getting it while pregnant, but wants to do it in 2  weeks.        Discussed plans for birth including pain management.         Weight gain in pregnancy reviewed, patient is on target. BP at target <130/80. . Problem list and pregnancy box updated: Yes.   Patient scheduled in [facility] during third trimester on 03/23/20.  Follow up 2 weeks.

## 2020-03-08 ENCOUNTER — Encounter: Payer: Self-pay | Admitting: Family Medicine

## 2020-03-20 NOTE — Progress Notes (Signed)
  [facility] Prenatal Visit  Whitney Allen is a 20 y.o. G1P0 at [redacted]w[redacted]d here for routine follow up. She is dated by midtrimester ultrasound.  She reports no complaints. She reports fetal movement. She denies vaginal bleeding, or loss of fluid. Irregular contractions See flow sheet for details.  Vitals:   03/23/20 0939  BP: 110/78  Pulse: 71    A/P: Pregnancy at [redacted]w[redacted]d.  Doing well.   1. Routine prenatal care  . Dating reviewed, dating tab is correct . Fetal heart tones Appropriate . Fundal height within expected range.  . Fetal position confirmed Vertex using Ultrasound .  Marland Kitchen GBS not collected today due to GBS + bacteruria, needs antibiotics during labor. .  . Repeat GC/CT collected today.  . The patient does not have a history of HSV and valacyclovir is not indicated at this time.  . Infant feeding choice: Breastfeeding . Contraception choice: IUD, discussed will have to get at health department and not able to get post-placental, she voiced understanding. . Infant circumcision desired not applicable . Influenza vaccine not yet taken, discussed with patient need to get at [facility] department . Tdap administered today.  Patient was supposed to get at previous appointment but declined due to cost, but previously had dog bite to face, and last tetanus > 5 yrs (2016), discussed with her it would likely cost >$100 to get here but felt strongly for her protection was important for her to get, she agreed to cost and was given today. Marland Kitchen COVID vaccination was discussed and she continues to decline, wants to get right after birth, counseled today.  . Pregnancy education regarding preterm labor, fetal movement,  benefits of breastfeeding, contraception, fetal growth, expected weight gain, and safe infant sleep were discussed.    2. Pregnancy issues include the following and were addressed as appropriate today:  # GBS bacteruria- will need antibiotics during labor, no  allergies. # Dog bite- Tdap today, well healed. Dog staying with her parents when baby delivers. # Elevated 1  Hr GTT 138, passed 3 hr GTT- fundal heights appropriate  . Problem list and pregnancy box updated: Yes.   Follow up 1 week.

## 2020-03-23 ENCOUNTER — Other Ambulatory Visit: Payer: Self-pay

## 2020-03-23 ENCOUNTER — Ambulatory Visit (INDEPENDENT_AMBULATORY_CARE_PROVIDER_SITE_OTHER): Payer: Self-pay | Admitting: Family Medicine

## 2020-03-23 ENCOUNTER — Other Ambulatory Visit (HOSPITAL_COMMUNITY)
Admission: RE | Admit: 2020-03-23 | Discharge: 2020-03-23 | Disposition: A | Discharge status: Home / Self Care | Payer: Self-pay | Source: Ambulatory Visit | Attending: Family Medicine | Admitting: Family Medicine

## 2020-03-23 VITALS — BP 110/78 | HR 71 | Wt 166.6 lb

## 2020-03-23 DIAGNOSIS — Z23 Encounter for immunization: Secondary | ICD-10-CM

## 2020-03-23 DIAGNOSIS — Z3402 Encounter for supervision of normal first pregnancy, second trimester: Secondary | ICD-10-CM | POA: Insufficient documentation

## 2020-03-23 NOTE — Patient Instructions (Addendum)
It was wonderful to see you today.  Please bring ALL of your medications with you to every visit.   Today we talked about:  - Swabs done today, will call you with results - see you back in one week - return precautions below - Make appt at health department to get your flu vaccines - T-dap given today - If you change your mind about your COVID vaccine you can get it here anytime- call for appt!   Thank you for choosing [facility].   Please call [phone] with any questions about today's appointment.  Please be sure to schedule follow up at the front  desk before you leave today.   Burley Saver, MD  Family Medicine   Pregnancy Related Return Precautions The follow are signs/symptoms that are abnormal in pregnancy and may require further evaluation by a physician: Go to the MAU at [facility] at [facility] if:  You have cramping/contractions that do not go away with drinking water, especially if they are lasting 30 seconds to 1.5 minutes, coming and going every 5-10 minutes for an hour or more, or are getting stronger and you cannot walk or talk while having a contraction/cramp.  Your water breaks.  Sometimes it is a big gush of fluid, sometimes it is just a trickle that keeps getting your underwear wet or running down your legs  You have vaginal bleeding.     You do not feel your baby moving like normal.  If you do not, get something to eat and drink (something cold or something with sugar like peanut butter or juice) and lay down and focus on feeling your baby move. If your baby is still not moving like normal, you should go to MAU. You should feel your baby move 6 times in one hour, or 10 times in two hours.  You have a persistent headache that does not go away with 1 g of Tylenol, vision changes, chest pain, difficulty breathing, severe pain in your right upper abdomen, worsening leg swelling- these can all be signs of high blood pressure in  pregnancy and need to be evaluated by a provider immediately  These are all concerning in pregnancy and if you have any of these I recommend you call your PCP and present to the Maternity Admissions Unit (map below) for further evaluation.  For any pregnancy-related emergencies, please go to the Maternity Admissions Unit in the [facility]'s & Children[facility] Center at [facility]. You will use hospital Entrance C.

## 2020-03-24 LAB — CERVICOVAGINAL ANCILLARY ONLY
Chlamydia: NEGATIVE
Comment: NEGATIVE
Comment: NEGATIVE
Comment: NORMAL
Neisseria Gonorrhea: POSITIVE — AB
Trichomonas: NEGATIVE

## 2020-03-27 ENCOUNTER — Ambulatory Visit (INDEPENDENT_AMBULATORY_CARE_PROVIDER_SITE_OTHER): Payer: Self-pay

## 2020-03-27 ENCOUNTER — Other Ambulatory Visit: Payer: Self-pay

## 2020-03-27 DIAGNOSIS — A549 Gonococcal infection, unspecified: Secondary | ICD-10-CM

## 2020-03-27 MED ORDER — CEFTRIAXONE SODIUM 500 MG IJ SOLR
500.0000 mg | Freq: Once | INTRAMUSCULAR | Status: AC
  Administered 2020-03-27: 500 mg via INTRAMUSCULAR

## 2020-03-27 NOTE — Progress Notes (Signed)
Patient in nurse clinic today for STD treatment of Gonorrhea.   Patient advised to abstain from sex for 7-10 days after treatment of self and partner.    Ceftriaxone 250 mg IM x 1 given in LUOQ and RUOQ per providers orders. Patient observed 15 minutes in office. No reaction noted.   Patient to follow up at next OB apt.   STD report form fax completed and faxed to [facility] Department at [phone] (STD department).

## 2020-03-28 ENCOUNTER — Ambulatory Visit (INDEPENDENT_AMBULATORY_CARE_PROVIDER_SITE_OTHER): Payer: Self-pay | Admitting: Family Medicine

## 2020-03-28 VITALS — BP 110/70 | HR 91

## 2020-03-28 DIAGNOSIS — Z3403 Encounter for supervision of normal first pregnancy, third trimester: Secondary | ICD-10-CM

## 2020-03-28 NOTE — Patient Instructions (Addendum)
It was a pleasure to see you today!  1. Go to the MAU at [facility] at [facility] if:  You begin to have strong, frequent contractions  Your water breaks.  Sometimes it is a big gush of fluid, sometimes it is just a trickle that keeps getting your underwear wet or running down your legs  You have vaginal bleeding.  It is normal to have a small amount of spotting if your cervix was checked.   You do not feel your baby moving like normal.  If you do not, get something to eat and drink and lay down and focus on feeling your baby move.   If your baby is still not moving like normal, you should go to MAU.  2. Follow up on 04/03/20   Be Well,  Dr. Leary Roca

## 2020-03-28 NOTE — Progress Notes (Signed)
  [facility] Prenatal Visit  Whitney Allen is a 20 y.o. G1P0 at [redacted]w[redacted]d here for routine follow up. She is dated by midtrimester ultrasound.  She reports backache. She reports fetal movement. She denies vaginal bleeding, contractions, or loss of fluid. See flow sheet for details.  There were no vitals filed for this visit.  A/P: Pregnancy at [redacted]w[redacted]d.  Doing well.   1. Routine prenatal care  . Dating reviewed, dating tab is correct . Fetal heart tones Appropriate . Fundal height within expected range.  . Fetal position confirmed Vertex using Leopold's .  Marland Kitchen GBS not collected today due to previously GBS positive on urine culture, needs abx during labor. .  . Repeat GC/CT not collected today due to positive last week for Gonorrhea. S/p tx 03/27/20. Will do TOC at next visit.  . The patient does not have a history of HSV and valacyclovir is not indicated at this time.  . Infant feeding choice: Breastfeeding . Contraception choice: IUD . Infant circumcision desired not applicable . Influenza vaccine not administered as patient declined, will continue to discuss.  - signed up to get at health department . Tdap previously administered between 27-36 weeks  . COVID vaccination was discussed and patient is interested in getting it, says she will go to [facility].  . Pregnancy education regarding preterm labor, fetal movement,  benefits of breastfeeding, contraception, fetal growth, expected weight gain, and safe infant sleep were discussed.    2. Pregnancy issues include the following and were addressed as appropriate today:  . Patient and partner are having problems after recent gonorrhea revelation. Patient will be moving to [place] on 04/10/20 to be with her parents. Will continue to monitor patient as long as she is in [place], recommend she obtain her medical records at the front to take with her to [place]. Recommend she call and establish ASAP with a practice in [place] region. Recommend  patient ask [facility] if they know providers in the [place] area.  GBS positive on urine culture, will need abx during delivery.  Gonorrhea (+) on 03/24/20, will need TOC likely at next visit. Patient treated with CTX IM on 03/27/20.  . Problem list and pregnancy box updated: Yes.   Follow up 1 week.

## 2020-04-03 ENCOUNTER — Ambulatory Visit (INDEPENDENT_AMBULATORY_CARE_PROVIDER_SITE_OTHER): Payer: Self-pay | Admitting: Family Medicine

## 2020-04-03 ENCOUNTER — Other Ambulatory Visit: Payer: Self-pay

## 2020-04-03 VITALS — BP 100/62 | HR 87 | Wt 168.2 lb

## 2020-04-03 DIAGNOSIS — O98213 Gonorrhea complicating pregnancy, third trimester: Secondary | ICD-10-CM

## 2020-04-03 DIAGNOSIS — Z3403 Encounter for supervision of normal first pregnancy, third trimester: Secondary | ICD-10-CM

## 2020-04-03 DIAGNOSIS — O98219 Gonorrhea complicating pregnancy, unspecified trimester: Secondary | ICD-10-CM | POA: Insufficient documentation

## 2020-04-03 NOTE — Patient Instructions (Addendum)
Go to the MAU at [facility] at [facility] if:  You begin to have strong, frequent contractions  Your water breaks.  Sometimes it is a big gush of fluid, sometimes it is just a trickle that keeps getting your underwear wet or running down your legs  You have vaginal bleeding.  It is normal to have a small amount of spotting if your cervix was checked.   You do not feel your baby moving like normal.  If you do not, get something to eat and drink and lay down and focus on feeling your baby move.   If your baby is still not moving like normal, you should go to MAU.  Next visit in 1 week  Be well, Dr. Pollie Meyer

## 2020-04-03 NOTE — Progress Notes (Signed)
[facility] Routine OB Visit  Whitney Allen is a 20 y.o. G1P0 at [redacted]w[redacted]d (via [redacted]w[redacted]d u/s) who presents for routine follow up. Seen today in lieu of Dr. Leary Roca due to provider scheduling change.   Denies fluid leaking, vaginal bleeding, or decreased fetal movement. Occasional irregular ctx. Taking PNV.    FHR: 130bpm Uterine size: 37cm  Assessment & Plan  1. Routine prenatal care: - vertex today via leopolds (previously confirmed vertex via bedside u/s at 36w) - declines flu shot today (plans to get at HD, offered here today) - continued to counsel on COVID vaccine, patient declines - labor & fetal movement precautions discussed. - follow up in 1 week scheduled   2. GBS bacteriuria - will need intrapartum antibiotics. Advised to go to hospital sooner rather than later after onset of labor given need for antibiotics.   3. Gonorrhea infection in third trimester of pregnancy - diagnosed 3/17, treated 3/21 with CTX 500mg  IM x1 - as it has only been 7 days since treatment, plan TOC at next visit  4. Social/care coordination - patient planning to move to [facility] on Tuesday 4/5, when she will be [redacted]w[redacted]d. Has no plans in place for prenatal follow up after her move. She plans to deliver at [facility]. - scheduled appointment 4/4 with Dr. 6/4 for one final visit here at [facility]  - I reached out to a colleague in [place], Dr. Payson, who is able to see patient in their clinic at [facility] for the remainder of her care. She will have someone from their office reach out to patient tomorrow to schedule an appointment. Dr. [facility] will see if records are accessible via Care Everywhere, and if not we will fax records to their office. - I called patient and advised she should get a phone call from [facility] to schedule that appointment some time tomorrow. Patient very appreciative.  [facility], MD [facility] Family  Medicine Faculty

## 2020-04-10 ENCOUNTER — Other Ambulatory Visit: Payer: Self-pay

## 2020-04-10 ENCOUNTER — Other Ambulatory Visit (HOSPITAL_COMMUNITY)
Admission: RE | Admit: 2020-04-10 | Discharge: 2020-04-10 | Disposition: A | Discharge status: Home / Self Care | Payer: Self-pay | Source: Ambulatory Visit | Attending: Family Medicine | Admitting: Family Medicine

## 2020-04-10 ENCOUNTER — Ambulatory Visit (INDEPENDENT_AMBULATORY_CARE_PROVIDER_SITE_OTHER): Payer: Self-pay | Admitting: Family Medicine

## 2020-04-10 VITALS — BP 116/71 | HR 113 | Wt 169.2 lb

## 2020-04-10 DIAGNOSIS — Z3403 Encounter for supervision of normal first pregnancy, third trimester: Secondary | ICD-10-CM

## 2020-04-10 DIAGNOSIS — O98213 Gonorrhea complicating pregnancy, third trimester: Secondary | ICD-10-CM | POA: Insufficient documentation

## 2020-04-10 NOTE — Progress Notes (Signed)
  [facility] Prenatal Visit  Whitney Allen is a 20 y.o. G1P0 at [redacted]w[redacted]d here for routine follow up. She is dated by midtrimester ultrasound.  She reports no bleeding, no contractions and no leaking. She reports fetal movement. She denies vaginal bleeding, contractions, or loss of fluid. See flow sheet for details.  Vitals:   04/10/20 1439  BP: 116/71  Pulse: (!) 113    A/P: Pregnancy at [redacted]w[redacted]d.  Doing well.   1. Routine prenatal care:  Marland Kitchen Dating reviewed, dating tab is correct . Fetal heart tones Appropriate . Fundal height within expected range.  . Fetal position confirmed Vertex using Leopold's .  Marland Kitchen Infant feeding choice: Breastfeeding . Contraception choice: IUD . Infant circumcision desired not applicable . Pain control in labor discussed and patient desires unmedicated labor, but is open to epidural if she feels she needs it.  . Influenza vaccine not administered as patient declined, will continue to discuss.   . Tdap previously administered between 27-36 weeks  . GBS and gc/chlamydia testing results were reviewed today.  Patient had GBS+ on urine culture, will need abx during labor (discussed).  . Pregnancy education regarding labor, fetal movement,  benefits of breastfeeding, contraception, and safe infant sleep were discussed.  . Labor and fetal movement precautions reviewed. . Induction of labor discussed. Patient is moving to [place], see below. I counseled her that she should be induced by 41w and should get at [facility] at [redacted]w[redacted]d if she remains pregnant.   2. Pregnancy issues include the following and were addressed as appropriate today:  . Moving: Patient is moving to [place], [place], tomorrow 4/5. Through Dr. Pollie Meyer, patient was set up with [facility]. She has an appointment on 4/7. Records printed for patient. . Problem list and pregnancy box updated: Yes.   TOC for GC obtained today. Confirmed patient's cell phone number. Will  call her with results in 1-2 days.   Repeat HR 97

## 2020-04-10 NOTE — Patient Instructions (Addendum)
It was a pleasure to see you today!  1. Go to the MAU at [facility] at [facility] if:  You begin to have strong, frequent contractions  Your water breaks.  Sometimes it is a big gush of fluid, sometimes it is just a trickle that keeps getting your underwear wet or running down your legs  You have vaginal bleeding.  It is normal to have a small amount of spotting if your cervix was checked.   You do not feel your baby moving like normal.  If you do not, get something to eat and drink and lay down and focus on feeling your baby move.   If your baby is still not moving like normal, you should go to MAU.  2. Make sure you are seen every week for the rest of your pregnancy. Important things to note: you are GBS positive and need antibiotics during labor.   3. I will call you with the result of the test today when it returns in 1-2 days.   Be Well,  Dr. Leary Roca

## 2020-04-17 ENCOUNTER — Telehealth: Payer: Self-pay | Admitting: Family Medicine

## 2020-04-17 LAB — CERVICOVAGINAL ANCILLARY ONLY
Chlamydia: NEGATIVE
Comment: NEGATIVE
Comment: NORMAL
Neisseria Gonorrhea: NEGATIVE

## 2020-04-17 NOTE — Telephone Encounter (Signed)
Called patient, discussed that results of GC/CT are negative. Patient reports that she went to new appointment with [facility] on 4/7, however did not have funds for out of pocket expense so she wasn't seen. She reports no LOF, no ctx, no vaginal bleeding, good fetal movements. She tried to reschedule appt, but next available appt isn't until 4/22. Discussed that patient should be seen every week as she is 39w4 days. Last seen by physician (me) on 4/5 and had appropriate fetal cardiac activity on doptones. Counseled patient that she should ensure visit with provider every week and be scheduled for induction and NST if she has not delivered by [redacted]w[redacted]d. Will forward to [facility] supervisors to help coordinate follow up. I reminded patient of return precautions to maternity ED, will call on Friday ([redacted]w[redacted]d) to check on patient.  Shirlean Mylar, MD [facility], PGY-2

## 2020-04-18 NOTE — Telephone Encounter (Signed)
I called and spoke with Dr. Harvie Heck at [facility] to clarify. She says there are financial assistance options available and that she should not have been turned away due to not being able to pay. She is going to see if she can find out what happened and whether there are appointments next week, and she'll get back to me today.  Latrelle Dodrill, MD

## 2020-04-19 NOTE — Telephone Encounter (Signed)
Update I got from Dr. Jerelyn Scott - patient must have received misinformation from a call center, as she would not have been sent away from their practice due to inability to pay. Unfortunately they had no available appts this week to reschedule her because of the holiday on Friday.  Given that tomorrow is patient's due date, I recommend the patient present to [facility] triage at [facility] and explain that she has moved there, is 40 weeks, and has no local prenatal provider, so they can do an NST, check her blood pressure, and ensure baby is well. Importantly, the patient should inform the providers there that her records should be visible in Care Everywhere.  The OB triage nursing staff will be able to discuss her care with the on-call OB/GYN. They may want to schedule an induction for her at 41w. Unfortunately at this point this is likely the best way to get her plugged in for care where she has moved. Dr. Jerelyn Scott agreed this is probably the best option in their system since she has not officially established care with anyone local.  Whitney Dodrill, MD

## 2020-04-21 ENCOUNTER — Telehealth: Payer: Self-pay | Admitting: Family Medicine

## 2020-04-21 NOTE — Telephone Encounter (Signed)
-----   Message from Shirlean Mylar, MD sent at 04/17/2020  5:59 PM EDT ----- Regarding: call! Call to check on patient- needs induction and NST scheduled if not yet delivered

## 2020-04-21 NOTE — Telephone Encounter (Signed)
Called patient and left VM yesterday 4/14, patient's due date. Repeated call today, [redacted]w[redacted]d. Patient states that she is still pregnant, no VB, no LOF, no CTX, + FM. She went to the HD in [place], [place], yesterday and had good heart tones on doppler per patient. They have scheduled her for induction on Tuesday, April 19. I reviewed labor precautions and when to go to hospital with patient.   Of note, yesterday I received a fax from [employer], who performed an anatomy US on this patient on November 29,2021. There was already an anatomy US done at [facility] MAU on 11/30/19 which showed no abnormal findings and growth was appropriate (she is dated by this [place] as her LMP was not reliable). The [place] from [employer] used patient's LMP and reports IUGR at 3rd %ile, but notes that the dating is likely wrong- which it was. Patient has measured appropriately throughout pregnancy, last visit 04/11/20. I do not suspect there to truly be a growth or dating discrepancy.   Shirlean Mylar, MD [facility] Residency, PGY-2
